# Patient Record
Sex: Female | Born: 2000 | Race: Black or African American | Hispanic: No | Marital: Single | Smoking: Current some day smoker
Health system: Southern US, Community
[De-identification: ages and names within clinical notes are randomized; demographics above are authoritative.]

## PROBLEM LIST (undated history)

## (undated) ENCOUNTER — Ambulatory Visit: Admission: EM

---
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
## 2018-12-10 ENCOUNTER — Encounter (HOSPITAL_COMMUNITY): Payer: Self-pay

## 2018-12-10 ENCOUNTER — Ambulatory Visit (HOSPITAL_COMMUNITY)
Admission: EM | Admit: 2018-12-10 | Discharge: 2018-12-10 | Disposition: A | Discharge status: Home / Self Care | Payer: BC Managed Care – PPO | Attending: Emergency Medicine | Admitting: Emergency Medicine

## 2018-12-10 DIAGNOSIS — J029 Acute pharyngitis, unspecified: Secondary | ICD-10-CM | POA: Insufficient documentation

## 2018-12-10 LAB — POCT RAPID STREP A: Streptococcus, Group A Screen (Direct): NEGATIVE

## 2018-12-10 NOTE — ED Triage Notes (Signed)
Pt states having sore throat x 1 week. Pt states her tonsils are swelling with white spots.

## 2018-12-10 NOTE — Discharge Instructions (Addendum)
Advised patient to gargle with salty warm water Will call patient if culture is positive Take Tylenol as needed for pain Return to clinic as needed or if symptom get worse

## 2018-12-10 NOTE — ED Provider Notes (Signed)
[place]    CSN: [id number] Arrival date & time: 12/10/18  2774      History   Chief Complaint Chief Complaint  Patient presents with  . Sore Throat    HPI Valerie Solomon is a 18 y.o. female.   The history is provided by the patient. No language interpreter was used.  Sore Throat This is a new problem. The current episode started more than 1 week ago. The problem occurs constantly. The problem has been gradually worsening. Pertinent negatives include no abdominal pain and no headaches. Nothing aggravates the symptoms. The symptoms are relieved by acetaminophen. She has tried acetaminophen for the symptoms.    History reviewed. No pertinent past medical history.  Patient Active Problem List   Diagnosis Date Noted  . Exercise-induced asthma 10/05/2016    History reviewed. No pertinent surgical history.  OB History   No obstetric history on file.      Home Medications    Prior to Admission medications   Medication Sig Start Date End Date Taking? Authorizing Provider  norgestimate-ethinyl estradiol (ORTHO-CYCLEN,SPRINTEC,PREVIFEM) 0.25-35 MG-MCG tablet Take 1 tablet by mouth daily.    [provider]  ondansetron (ZOFRAN ODT) 4 MG disintegrating tablet Take 1 tablet (4 mg total) by mouth every 8 (eight) hours as needed for nausea or vomiting. 09/25/17   Horton, Barbette Hair, MD  Peak Flow Meter DEVI 1 Device by Does not apply route as directed. 10/05/16   Rigoberto Noel, MD    Family History History reviewed. No pertinent family history.  Social History Social History   Tobacco Use  . Smoking status: Current Some Day Smoker  . Smokeless tobacco: Never Used  Substance Use Topics  . Alcohol use: Yes  . Drug use: Yes    Types: Marijuana     Allergies   Patient has no known allergies.   Review of Systems Review of Systems  Constitutional: Negative for activity change, appetite change, chills, fatigue and fever.  HENT: Positive for sore  throat. Negative for congestion.   Respiratory: Negative for cough.   Gastrointestinal: Negative for abdominal pain.  Neurological: Negative for headaches.     Physical Exam Triage Vital Signs ED Triage Vitals  Enc Vitals Group     BP 12/10/18 1208 (!) 134/93     Pulse Rate 12/10/18 1208 74     Resp 12/10/18 1208 16     Temp 12/10/18 1208 97.7 F (36.5 C)     Temp Source 12/10/18 1208 Temporal     SpO2 12/10/18 1208 99 %     Weight --      Height --      Head Circumference --      Peak Flow --      Pain Score 12/10/18 1205 8     Pain Loc --      Pain Edu? --      Excl. in [place]? --    No data found.  Updated Vital Signs BP (!) 134/93 (BP Location: Left Arm)   Pulse 74   Temp 97.7 F (36.5 C) (Temporal)   Resp 16   SpO2 99%   Visual Acuity Right Eye Distance:   Left Eye Distance:   Bilateral Distance:    Right Eye Near:   Left Eye Near:    Bilateral Near:     Physical Exam Constitutional:      Appearance: She is well-developed.  HENT:     Right Ear: Tympanic membrane and ear canal  normal.     Left Ear: Tympanic membrane and ear canal normal.     Nose: No congestion.     Mouth/Throat:     Mouth: Mucous membranes are moist.     Tonsils: Tonsillar exudate present. 3+ on the right. 2+ on the left.  Cardiovascular:     Rate and Rhythm: Normal rate and regular rhythm.  Pulmonary:     Effort: Pulmonary effort is normal.     Breath sounds: Normal breath sounds.  Neurological:     Mental Status: She is alert.      UC Treatments / Results  Labs (all labs ordered are listed, but only abnormal results are displayed) Labs Reviewed  CULTURE, GROUP A STREP [facility])  POCT RAPID STREP A    EKG   Radiology No results found.  Procedures Procedures (including critical care time)  Medications Ordered in UC Medications - No data to display  Initial Impression / Assessment and Plan / UC Course  I have reviewed the triage vital signs and the nursing notes.   Pertinent labs & imaging results that were available during my care of the patient were reviewed by me and considered in my medical decision making (see chart for details).  Clinical Course as of Dec 10 1311  Tue Dec 10, 2018  1232 POCT Rapid Strep [[facility]]    Clinical Course User Index [[facility]] Durward Parcel, FNP    Negative strep test  Will send it for culture  Continue to use Tylenol Prn for pain Return to cliinic as needed or if symptom get worse  Final Clinical Impressions(s) / UC Diagnoses   Final diagnoses:  Pharyngitis, unspecified etiology     Discharge Instructions     Advised patient to gargle with salty warm water Will call patient if culture is positive Take Tylenol as needed for pain Return to clinic as needed or if symptom get worse    ED Prescriptions    None     PDMP not reviewed this encounter.   Durward Parcel, FNP 12/10/18 1316

## 2018-12-12 LAB — CULTURE, GROUP A STREP (THRC)

## 2018-12-17 ENCOUNTER — Encounter (HOSPITAL_COMMUNITY): Payer: Self-pay | Admitting: Emergency Medicine

## 2018-12-17 ENCOUNTER — Other Ambulatory Visit: Payer: Self-pay

## 2018-12-17 ENCOUNTER — Emergency Department (HOSPITAL_COMMUNITY)
Admission: EM | Admit: 2018-12-17 | Discharge: 2018-12-17 | Disposition: A | Discharge status: Home / Self Care | Payer: BC Managed Care – PPO | Attending: Emergency Medicine | Admitting: Emergency Medicine

## 2018-12-17 DIAGNOSIS — J45909 Unspecified asthma, uncomplicated: Secondary | ICD-10-CM | POA: Diagnosis not present

## 2018-12-17 DIAGNOSIS — J029 Acute pharyngitis, unspecified: Secondary | ICD-10-CM | POA: Diagnosis not present

## 2018-12-17 DIAGNOSIS — F17200 Nicotine dependence, unspecified, uncomplicated: Secondary | ICD-10-CM | POA: Diagnosis not present

## 2018-12-17 DIAGNOSIS — R070 Pain in throat: Secondary | ICD-10-CM | POA: Diagnosis present

## 2018-12-17 DIAGNOSIS — Z79899 Other long term (current) drug therapy: Secondary | ICD-10-CM | POA: Insufficient documentation

## 2018-12-17 LAB — MONONUCLEOSIS SCREEN: Mono Screen: POSITIVE — AB

## 2018-12-17 MED ORDER — HYDROCODONE-ACETAMINOPHEN 7.5-325 MG/15ML PO SOLN: 15.0000 mL | Freq: Four times a day (QID) | ORAL | 0 refills | Status: DC | PRN

## 2018-12-17 MED ORDER — HYDROCODONE-ACETAMINOPHEN 7.5-325 MG/15ML PO SOLN
15.0000 mL | Freq: Once | ORAL | Status: AC
  Administered 2018-12-17: 05:00:00 15 mL via ORAL
  Filled 2018-12-17: qty 15

## 2018-12-17 NOTE — ED Triage Notes (Signed)
Pt c/o sore throat x 1 week. States she has tested negative for COVID and strep, seen at multiple urgent cares for same as well as ENT. States she was diagnosed with viral tonsillitis by ENT, and has tried antibiotics with no change.

## 2018-12-17 NOTE — ED Provider Notes (Signed)
[facility] EMERGENCY DEPARTMENT Provider Note   CSN: [id number] Arrival date & time: 12/17/18  0256     History   Chief Complaint Chief Complaint  Patient presents with  . Sore Throat    HPI Valerie Solomon is a 18 y.o. female.     18 year old who presents for sore throat.  Patient has had symptoms for approximately 2 weeks.  She was seen at an urgent care and prescribed prednisone and an antibiotic.  The pain persisted and she followed up with another urgent care and ENT.  ENT said she likely had viral pharyngitis and to continue symptomatic care.  However the pain continues and patient has finished her steroid course and almost done with antibiotics.  The pain does not lateralize.  No fevers noted.  No cough or URI symptoms.  No abdominal pain.  No rash.  The history is provided by the patient. No language interpreter was used.  Sore Throat This is a new problem. The current episode started more than 1 week ago. The problem occurs constantly. The problem has not changed since onset.Pertinent negatives include no chest pain, no abdominal pain, no headaches and no shortness of breath. The symptoms are aggravated by swallowing. Nothing relieves the symptoms. Treatments tried: Antibiotics and steroids. The treatment provided no relief.    History reviewed. No pertinent past medical history.  Patient Active Problem List   Diagnosis Date Noted  . Exercise-induced asthma 10/05/2016    History reviewed. No pertinent surgical history.   OB History   No obstetric history on file.      Home Medications    Prior to Admission medications   Medication Sig Start Date End Date Taking? Authorizing Provider  HYDROcodone-acetaminophen (HYCET) 7.5-325 mg/15 ml solution Take 15 mLs by mouth 4 (four) times daily as needed for moderate pain. 12/17/18   Niel Hummer, MD  norgestimate-ethinyl estradiol (ORTHO-CYCLEN,SPRINTEC,PREVIFEM) 0.25-35 MG-MCG tablet Take 1 tablet by  mouth daily.    [provider]  ondansetron (ZOFRAN ODT) 4 MG disintegrating tablet Take 1 tablet (4 mg total) by mouth every 8 (eight) hours as needed for nausea or vomiting. 09/25/17   Horton, Mayer Masker, MD  Peak Flow Meter DEVI 1 Device by Does not apply route as directed. 10/05/16   Oretha Milch, MD    Family History No family history on file.  Social History Social History   Tobacco Use  . Smoking status: Current Some Day Smoker  . Smokeless tobacco: Never Used  Substance Use Topics  . Alcohol use: Yes  . Drug use: Yes    Types: Marijuana     Allergies   Penicillin g   Review of Systems Review of Systems  Respiratory: Negative for shortness of breath.   Cardiovascular: Negative for chest pain.  Gastrointestinal: Negative for abdominal pain.  Neurological: Negative for headaches.  All other systems reviewed and are negative.    Physical Exam Updated Vital Signs BP 128/82   Pulse 75   Temp 98 F (36.7 C) (Oral)   Resp 18   SpO2 99%   Physical Exam Vitals signs and nursing note reviewed.  Constitutional:      Appearance: She is well-developed.  HENT:     Head: Normocephalic and atraumatic.     Right Ear: External ear normal.     Left Ear: External ear normal.     Mouth/Throat:     Mouth: Mucous membranes are moist.     Pharynx: Oropharyngeal exudate present.  Tonsils: Tonsillar exudate present. 3+ on the right. 3+ on the left.  Eyes:     Conjunctiva/sclera: Conjunctivae normal.  Neck:     Musculoskeletal: Normal range of motion and neck supple.  Cardiovascular:     Rate and Rhythm: Normal rate.     Heart sounds: Normal heart sounds.  Pulmonary:     Effort: Pulmonary effort is normal.     Breath sounds: Normal breath sounds.  Abdominal:     General: Bowel sounds are normal.     Palpations: Abdomen is soft.     Tenderness: There is no abdominal tenderness. There is no rebound.  Musculoskeletal: Normal range of motion.  Skin:     General: Skin is warm.  Neurological:     Mental Status: She is alert and oriented to person, place, and time.      ED Treatments / Results  Labs (all labs ordered are listed, but only abnormal results are displayed) Labs Reviewed  MONONUCLEOSIS SCREEN - Abnormal; Notable for the following components:      Result Value   Mono Screen POSITIVE (*)    All other components within normal limits    EKG None  Radiology No results found.  Procedures Procedures (including critical care time)  Medications Ordered in ED Medications  HYDROcodone-acetaminophen (HYCET) 7.5-325 mg/15 ml solution 15 mL (15 mLs Oral Given 12/17/18 0439)     Initial Impression / Assessment and Plan / ED Course  I have reviewed the triage vital signs and the nursing notes.  Pertinent labs & imaging results that were available during my care of the patient were reviewed by me and considered in my medical decision making (see chart for details).        18 year old who presents for persistent pharyngitis despite of course of oral antibiotics and prednisone.  Symptoms have been going on for approximately 2 weeks.  Will send mono test.  Will give patient pain medications.  Pain does not lateralize and no pain to palpation of neck to suggest abscess.  No muffled voice.  Patient feels better after pain medication will discharge home with pain medications.    Pt is mono positive. Discussed symptomatic care. Discussed signs that warrant reevaluation. Patient to follow up with PCP in 2-3 days if not improved.     Final Clinical Impressions(s) / ED Diagnoses   Final diagnoses:  Viral pharyngitis    ED Discharge Orders         Ordered    HYDROcodone-acetaminophen (HYCET) 7.5-325 mg/15 ml solution  4 times daily PRN,   Status:  Discontinued     12/17/18 0605    HYDROcodone-acetaminophen (HYCET) 7.5-325 mg/15 ml solution  4 times daily PRN     12/17/18 3154           Louanne Skye, MD 12/17/18 [phone]

## 2018-12-17 NOTE — ED Notes (Signed)
ED Provider at bedside. 
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
## 2021-03-18 ENCOUNTER — Ambulatory Visit
Admission: EM | Admit: 2021-03-18 | Discharge: 2021-03-18 | Disposition: A | Discharge status: Home / Self Care | Payer: BC Managed Care – PPO | Attending: Emergency Medicine | Admitting: Emergency Medicine

## 2021-03-18 ENCOUNTER — Other Ambulatory Visit: Payer: Self-pay

## 2021-03-18 DIAGNOSIS — L03116 Cellulitis of left lower limb: Secondary | ICD-10-CM

## 2021-03-18 MED ORDER — SULFAMETHOXAZOLE-TRIMETHOPRIM 800-160 MG PO TABS: 1.0000 | ORAL_TABLET | Freq: Two times a day (BID) | ORAL | 0 refills | Status: AC

## 2021-03-18 NOTE — Discharge Instructions (Addendum)
To treat the infection of the inner aspect of her left thigh, please begin Bactrim, 1 tablet twice daily for the next 5 days.  If the redness and swelling continues is worsened while you are taking this antibiotic, please report to the emergency room for further evaluation, you may need to have a culture obtained from the wound to identify the bacteria present.  Thank you for visiting urgent care today.

## 2021-03-18 NOTE — ED Triage Notes (Signed)
Pt reports having an abscess to left inner thigh, she reports there is swelling around the area.

## 2021-03-18 NOTE — ED Provider Notes (Signed)
[facility]    CSN: [id number] Arrival date & time: 03/18/21  1427    HISTORY   Chief Complaint  Patient presents with   Abscess   HPI Valerie Solomon is a 21 y.o. female. Pt reports having an abscess to left inner thigh, she reports there is swelling in the area with some redness, tender to touch.  Patient states she noticed yesterday.  Patient states has not tried anything to make it better.  Patient states nothing is made it worse other than friction from walking.  The history is provided by the patient.  History reviewed. No pertinent past medical history. Patient Active Problem List   Diagnosis Date Noted   Exercise-induced asthma 10/05/2016   History reviewed. No pertinent surgical history. OB History   No obstetric history on file.    Home Medications    Prior to Admission medications   Medication Sig Start Date End Date Taking? Authorizing Provider    Family History History reviewed. No pertinent family history. Social History Social History   Tobacco Use   Smoking status: Some Days   Smokeless tobacco: Never  Vaping Use   Vaping Use: Never used  Substance Use Topics   Alcohol use: Yes   Drug use: Yes    Types: Marijuana   Allergies   Penicillin g  Review of Systems Review of Systems Pertinent findings noted in history of present illness.   Physical Exam Triage Vital Signs ED Triage Vitals  Enc Vitals Group     BP 11/26/20 0827 (!) 147/82     Pulse Rate 11/26/20 0827 72     Resp 11/26/20 0827 18     Temp 11/26/20 0827 98.3 F (36.8 C)     Temp Source 11/26/20 0827 Oral     SpO2 11/26/20 0827 98 %     Weight --      Height --      Head Circumference --      Peak Flow --      Pain Score 11/26/20 0826 5     Pain Loc --      Pain Edu? --      Excl. in [place]? --   No data found.  Updated Vital Signs BP 111/72 (BP Location: Right Arm)    Pulse 66    Temp 98.3 F (36.8 C) (Oral)    Resp 18    LMP 02/27/2021 (Approximate)    SpO2  98%   Physical Exam Vitals and nursing note reviewed.  Constitutional:      General: She is not in acute distress.    Appearance: Normal appearance. She is not ill-appearing.  HENT:     Head: Normocephalic and atraumatic.  Eyes:     General: Lids are normal.        Right eye: No discharge.        Left eye: No discharge.     Extraocular Movements: Extraocular movements intact.     Conjunctiva/sclera: Conjunctivae normal.     Right eye: Right conjunctiva is not injected.     Left eye: Left conjunctiva is not injected.  Neck:     Trachea: Trachea and phonation normal.  Cardiovascular:     Rate and Rhythm: Normal rate and regular rhythm.     Pulses: Normal pulses.     Heart sounds: Normal heart sounds. No murmur heard.   No friction rub. No gallop.  Pulmonary:     Effort: Pulmonary effort is normal. No accessory muscle usage,  prolonged expiration or respiratory distress.     Breath sounds: Normal breath sounds. No stridor, decreased air movement or transmitted upper airway sounds. No decreased breath sounds, wheezing, rhonchi or rales.  Chest:     Chest wall: No tenderness.  Musculoskeletal:        General: Normal range of motion.     Cervical back: Normal range of motion and neck supple. Normal range of motion.  Lymphadenopathy:     Cervical: No cervical adenopathy.  Skin:    General: Skin is warm and dry.     Findings: Lesion (Cellulitis, left inner thigh, superficial) present. No erythema or rash.  Neurological:     General: No focal deficit present.     Mental Status: She is alert and oriented to person, place, and time.  Psychiatric:        Mood and Affect: Mood normal.        Behavior: Behavior normal.    Visual Acuity Right Eye Distance:   Left Eye Distance:   Bilateral Distance:    Right Eye Near:   Left Eye Near:    Bilateral Near:     UC Couse / Diagnostics / Procedures:    EKG  Radiology No results found.  Procedures Procedures (including critical  care time)  UC Diagnoses / Final Clinical Impressions(s)   I have reviewed the triage vital signs and the nursing notes.  Pertinent labs & imaging results that were available during my care of the patient were reviewed by me and considered in my medical decision making (see chart for details).    Final diagnoses:  Cellulitis of leg, left   Begin Bactrim for 5 days.  Follow-up if not resolved.  For to emergency room if worsening while taking Bactrim.  ED Prescriptions     Medication Sig Dispense Auth. Provider   sulfamethoxazole-trimethoprim (BACTRIM DS) 800-160 MG tablet Take 1 tablet by mouth 2 (two) times daily for 5 days. 10 tablet Theadora Rama Scales, PA-C      PDMP not reviewed this encounter.  Pending results:  Labs Reviewed - No data to display  Medications Ordered in UC: Medications - No data to display  Disposition Upon Discharge:  Condition: stable for discharge home Home: take medications as prescribed; routine discharge instructions as discussed; follow up as advised.  Patient presented with an acute illness with associated systemic symptoms and significant discomfort requiring urgent management. In my opinion, this is a condition that a prudent lay person (someone who possesses an average knowledge of health and medicine) may potentially expect to result in complications if not addressed urgently such as respiratory distress, impairment of bodily function or dysfunction of bodily organs.   Routine symptom specific, illness specific and/or disease specific instructions were discussed with the patient and/or caregiver at length.   As such, the patient has been evaluated and assessed, work-up was performed and treatment was provided in alignment with urgent care protocols and evidence based medicine.  Patient/parent/caregiver has been advised that the patient may require follow up for further testing and treatment if the symptoms continue in spite of treatment, as  clinically indicated and appropriate.  If the patient was tested for COVID-19, Influenza and/or RSV, then the patient/parent/guardian was advised to isolate at home pending the results of his/her diagnostic coronavirus test and potentially longer if theyre positive. I have also advised pt that if his/her COVID-19 test returns positive, it's recommended to self-isolate for at least 10 days after symptoms first appeared AND until  fever-free for 24 hours without fever reducer AND other symptoms have improved or resolved. Discussed self-isolation recommendations as well as instructions for household member/close contacts as per the [facility] and [place] DHHS, and also gave patient the COVID packet with this information.  Patient/parent/caregiver has been advised to return to the [facility] or PCP in 3-5 days if no better; to PCP or the Emergency Department if new signs and symptoms develop, or if the current signs or symptoms continue to change or worsen for further workup, evaluation and treatment as clinically indicated and appropriate  The patient will follow up with their current PCP if and as advised. If the patient does not currently have a PCP we will assist them in obtaining one.   The patient may need specialty follow up if the symptoms continue, in spite of conservative treatment and management, for further workup, evaluation, consultation and treatment as clinically indicated and appropriate.   Patient/parent/caregiver verbalized understanding and agreement of plan as discussed.  All questions were addressed during visit.  Please see discharge instructions below for further details of plan.  Discharge Instructions:   Discharge Instructions      To treat the infection of the inner aspect of her left thigh, please begin Bactrim, 1 tablet twice daily for the next 5 days.  If the redness and swelling continues is worsened while you are taking this antibiotic, please report to the emergency room for further  evaluation, you may need to have a culture obtained from the wound to identify the bacteria present.  Thank you for visiting urgent care today.    This office note has been dictated using Teaching laboratory technician.  Unfortunately, and despite my best efforts, this method of dictation can sometimes lead to occasional typographical or grammatical errors.  I apologize in advance if this occurs.     Theadora Rama Scales, PA-C 03/18/21 [phone]

## 2023-07-17 ENCOUNTER — Emergency Department (HOSPITAL_COMMUNITY): Payer: Self-pay

## 2023-07-17 ENCOUNTER — Emergency Department (HOSPITAL_COMMUNITY)
Admission: EM | Admit: 2023-07-17 | Discharge: 2023-07-17 | Disposition: A | Discharge status: Home / Self Care | Payer: Self-pay | Attending: Emergency Medicine | Admitting: Emergency Medicine

## 2023-07-17 ENCOUNTER — Other Ambulatory Visit: Payer: Self-pay

## 2023-07-17 DIAGNOSIS — S60922A Unspecified superficial injury of left hand, initial encounter: Secondary | ICD-10-CM | POA: Diagnosis present

## 2023-07-17 DIAGNOSIS — Y92410 Unspecified street and highway as the place of occurrence of the external cause: Secondary | ICD-10-CM | POA: Insufficient documentation

## 2023-07-17 DIAGNOSIS — S0990XA Unspecified injury of head, initial encounter: Secondary | ICD-10-CM | POA: Diagnosis not present

## 2023-07-17 DIAGNOSIS — S6292XA Unspecified fracture of left wrist and hand, initial encounter for closed fracture: Secondary | ICD-10-CM | POA: Insufficient documentation

## 2023-07-17 DIAGNOSIS — Z23 Encounter for immunization: Secondary | ICD-10-CM | POA: Diagnosis not present

## 2023-07-17 LAB — COMPREHENSIVE METABOLIC PANEL WITH GFR
ALT: 22 U/L (ref 0–44)
AST: 24 U/L (ref 15–41)
Albumin: 4.1 g/dL (ref 3.5–5.0)
Alkaline Phosphatase: 64 U/L (ref 38–126)
Anion gap: 11 (ref 5–15)
BUN: 8 mg/dL (ref 6–20)
CO2: 23 mmol/L (ref 22–32)
Calcium: 9.2 mg/dL (ref 8.9–10.3)
Chloride: 102 mmol/L (ref 98–111)
Creatinine, Ser: 0.67 mg/dL (ref 0.44–1.00)
GFR, Estimated: >60 mL/min (ref >60)
Glucose, Bld: 101 mg/dL — ABNORMAL HIGH (ref 70–99)
Potassium: 3.5 mmol/L (ref 3.5–5.1)
Sodium: 136 mmol/L (ref 135–145)
Total Bilirubin: 0.8 mg/dL (ref 0.0–1.2)
Total Protein: 7.3 g/dL (ref 6.5–8.1)

## 2023-07-17 LAB — CBC
HCT: 40.2 % (ref 36.0–46.0)
Hemoglobin: 13.4 g/dL (ref 12.0–15.0)
MCH: 27.2 pg (ref 26.0–34.0)
MCHC: 33.3 g/dL (ref 30.0–36.0)
MCV: 81.7 fL (ref 80.0–100.0)
Platelets: 424 10*3/uL — ABNORMAL HIGH (ref 150–400)
RBC: 4.92 MIL/uL (ref 3.87–5.11)
RDW: 13 % (ref 11.5–15.5)
WBC: 9.3 10*3/uL (ref 4.0–10.5)
nRBC: 0 % (ref 0.0–0.2)

## 2023-07-17 LAB — I-STAT CHEM 8, ED
BUN: 8 mg/dL (ref 6–20)
Calcium, Ion: 1.14 mmol/L — ABNORMAL LOW (ref 1.15–1.40)
Chloride: 103 mmol/L (ref 98–111)
Creatinine, Ser: 0.7 mg/dL (ref 0.44–1.00)
Glucose, Bld: 99 mg/dL (ref 70–99)
HCT: 41 % (ref 36.0–46.0)
Hemoglobin: 13.9 g/dL (ref 12.0–15.0)
Potassium: 3.5 mmol/L (ref 3.5–5.1)
Sodium: 139 mmol/L (ref 135–145)
TCO2: 23 mmol/L (ref 22–32)

## 2023-07-17 LAB — I-STAT CG4 LACTIC ACID, ED: Lactic Acid, Venous: 1.2 mmol/L (ref 0.5–1.9)

## 2023-07-17 MED ORDER — TETANUS-DIPHTH-ACELL PERTUSSIS 5-2.5-18.5 LF-MCG/0.5 IM SUSY
0.5000 mL | PREFILLED_SYRINGE | Freq: Once | INTRAMUSCULAR | Status: AC
  Administered 2023-07-17: 0.5 mL via INTRAMUSCULAR

## 2023-07-17 NOTE — ED Notes (Signed)
 Got patient on the monitor did manual blood pressure patient is resting with call bell in reach
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
## 2023-07-17 NOTE — Progress Notes (Signed)
 Orthopedic Tech Progress Note Patient Details:  Valerie Solomon April 08, 2000 [id number]  Ortho Devices Type of Ortho Device: Ulna gutter splint Ortho Device/Splint Location: LUE Ortho Device/Splint Interventions: Ordered, Adjustment, Application   Post Interventions Patient Tolerated: Well Instructions Provided: Adjustment of device  Edmonia Gottron 07/17/2023, 5:59 PM
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
## 2023-07-17 NOTE — ED Notes (Signed)
Patient to CT with this RN.
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
## 2024-01-30 ENCOUNTER — Ambulatory Visit

## 2024-01-30 ENCOUNTER — Ambulatory Visit: Payer: Self-pay
